# Patient Record
Sex: Female | Born: 1995 | Race: Black or African American | Hispanic: No | Marital: Single | Smoking: Never smoker
Health system: Southern US, Community
[De-identification: ages and names within clinical notes are randomized; demographics above are authoritative.]

## PROBLEM LIST (undated history)

## (undated) DIAGNOSIS — E119 Type 2 diabetes mellitus without complications: Secondary | ICD-10-CM

## (undated) HISTORY — DX: Type 2 diabetes mellitus without complications: E11.9

## (undated) HISTORY — PX: NO PAST SURGERIES: SHX2092

---
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
## 2013-08-20 ENCOUNTER — Emergency Department: Payer: Self-pay | Admitting: Internal Medicine

## 2017-07-07 ENCOUNTER — Emergency Department
Admission: EM | Admit: 2017-07-07 | Discharge: 2017-07-07 | Disposition: A | Discharge status: Home / Self Care | Payer: BLUE CROSS/BLUE SHIELD | Attending: Emergency Medicine | Admitting: Emergency Medicine

## 2017-07-07 ENCOUNTER — Emergency Department: Payer: BLUE CROSS/BLUE SHIELD

## 2017-07-07 ENCOUNTER — Encounter: Payer: Self-pay | Admitting: Emergency Medicine

## 2017-07-07 DIAGNOSIS — R42 Dizziness and giddiness: Secondary | ICD-10-CM | POA: Insufficient documentation

## 2017-07-07 DIAGNOSIS — R51 Headache: Secondary | ICD-10-CM | POA: Insufficient documentation

## 2017-07-07 DIAGNOSIS — G44209 Tension-type headache, unspecified, not intractable: Secondary | ICD-10-CM

## 2017-07-07 MED ORDER — BUTALBITAL-APAP-CAFFEINE 50-325-40 MG PO TABS: 1.0000 | ORAL_TABLET | Freq: Four times a day (QID) | ORAL | 0 refills | Status: AC | PRN

## 2017-07-07 MED ORDER — BUTALBITAL-APAP-CAFFEINE 50-325-40 MG PO TABS
2.0000 | ORAL_TABLET | Freq: Once | ORAL | Status: AC
  Administered 2017-07-07: 2 via ORAL
  Filled 2017-07-07: qty 2

## 2017-07-07 NOTE — ED Triage Notes (Signed)
Patient ambulatory to triage with steady gait, without difficulty or distress noted; pt reports "tightness" to top and sides of head x week

## 2017-07-07 NOTE — ED Provider Notes (Signed)
[facility] Emergency Department Provider Note   ____________________________________________   First MD Initiated Contact with Patient 07/07/17 [phone]     (approximate)  I have reviewed the triage vital signs and the nursing notes.   HISTORY  Chief Complaint Headache    HPI Monica Reynolds is a 21 y.o. female who comes into the hospital today with a tightness on the top of her head and on the sides. The patient reports that she's been having it on and off for about a week. She states that tonight it was very bad so she decided to come in for evaluation. The patient has been taking Tylenol with no relief of her symptoms. She denies any nausea or vomiting but has had some dizziness. The patient reports that she is not having any dizziness right now. She's never had headaches like this before but reports that she's had some increase in her stress. She's been out of work for the past 4 months and cannot find a job. The patient denies any blurred vision numbness or tingling. Her pain is currently a 6 out of 10 in intensity. Again she is here for evaluation.   History reviewed. No pertinent past medical history.  There are no active problems to display for this patient.   History reviewed. No pertinent surgical history.  Prior to Admission medications   Medication Sig Start Date End Date Taking? Authorizing Provider  butalbital-acetaminophen-caffeine (FIORICET, ESGIC) 804165291650-325-40 MG tablet Take 1-2 tablets by mouth every 6 (six) hours as needed for headache. 07/07/17 07/07/18  Rebecka ApleyWebster, Alleta Avery P, MD    Allergies Patient has no known allergies.  No family history on file.  Social History Social History  Substance Use Topics  . Smoking status: Never Smoker  . Smokeless tobacco: Not on file  . Alcohol use No    Review of Systems  Constitutional: No fever/chills Eyes: No visual changes. ENT: No sore throat. Cardiovascular: Denies chest pain. Respiratory:  Denies shortness of breath. Gastrointestinal: No abdominal pain.  No nausea, no vomiting.  No diarrhea.  No constipation. Genitourinary: Negative for dysuria. Musculoskeletal: Negative for back pain. Skin: Negative for rash. Neurological: Headache and dizziness   ____________________________________________   PHYSICAL EXAM:  VITAL SIGNS: ED Triage Vitals  Enc Vitals Group     BP 07/07/17 0233 128/68     Pulse Rate 07/07/17 0233 74     Resp 07/07/17 0233 18     Temp 07/07/17 0233 99 F (37.2 C)     Temp Source 07/07/17 0233 Oral     SpO2 07/07/17 0233 100 %     Weight 07/07/17 0232 200 lb (90.7 kg)     Height 07/07/17 0232 5\' 1"  (1.549 m)     Head Circumference --      Peak Flow --      Pain Score 07/07/17 0232 7     Pain Loc --      Pain Edu? --      Excl. in GC? --     Constitutional: Alert and oriented. Well appearing and in Mild distress. Eyes: Conjunctivae are normal. PERRL. EOMI. Head: Atraumatic. Nose: No congestion/rhinnorhea. Mouth/Throat: Mucous membranes are moist.  Oropharynx non-erythematous. Cardiovascular: Normal rate, regular rhythm. Grossly normal heart sounds.  Good peripheral circulation. Respiratory: Normal respiratory effort.  No retractions. Lungs CTAB. Gastrointestinal: Soft and nontender. No distention. Positive bowel sounds Musculoskeletal: No lower extremity tenderness nor edema.   Neurologic:  Normal speech and language. Cranial nerves II through XII  are grossly intact with no focal motor or neuro deficits Skin:  Skin is warm, dry and intact.  Psychiatric: Mood and affect are normal.   ____________________________________________   LABS (all labs ordered are listed, but only abnormal results are displayed)  Labs Reviewed - No data to display ____________________________________________  EKG  none ____________________________________________  RADIOLOGY  Ct Head Wo Contrast  Result Date: 07/07/2017 CLINICAL DATA:  Headache for 1  week. EXAM: CT HEAD WITHOUT CONTRAST TECHNIQUE: Contiguous axial images were obtained from the base of the skull through the vertex without intravenous contrast. COMPARISON:  None. FINDINGS: Brain: No intracranial hemorrhage, mass effect, or midline shift. No hydrocephalus. The basilar cisterns are patent. No evidence of territorial infarct. No extra-axial or intracranial fluid collection. Vascular: No hyperdense vessel or unexpected calcification. Skull: Normal. Negative for fracture or focal lesion. Sinuses/Orbits: Paranasal sinuses and mastoid air cells are clear. The visualized orbits are unremarkable. Other: None. IMPRESSION: Unremarkable noncontrast head CT. Electronically Signed   By: Rubye OaksMelanie  Ehinger M.D.   On: 07/07/2017 03:57    ____________________________________________   PROCEDURES  Procedure(s) performed: None  Procedures  Critical Care performed: No  ____________________________________________   INITIAL IMPRESSION / ASSESSMENT AND PLAN / ED COURSE  Pertinent labs & imaging results that were available during my care of the patient were reviewed by me and considered in my medical decision making (see chart for details).  This is a 21 year old female who comes into the hospital today with some headache. She reports that this tightness around her head and she has been having some increased stress. I gave the patient 2 Fioricet and I checked a CT scan. The patient's CT scan is unremarkable and after the Fioricet the patient's headache is improved. The patient also reports that she has not been drinking much water. I discussed with the patient that dehydration can lead to headaches as can increase stress. As the patient is feeling improved I will discharge her to home to have her follow-up with her primary care physician or the acute care clinic.      ____________________________________________   FINAL CLINICAL IMPRESSION(S) / ED DIAGNOSES  Final diagnoses:  Acute non  intractable tension-type headache      NEW MEDICATIONS STARTED DURING THIS VISIT:  Discharge Medication List as of 07/07/2017  4:33 AM    START taking these medications   Details  butalbital-acetaminophen-caffeine (FIORICET, ESGIC) 50-325-40 MG tablet Take 1-2 tablets by mouth every 6 (six) hours as needed for headache., Starting Wed 07/07/2017, Until Thu 07/07/2018, Print         Note:  This document was prepared using Dragon voice recognition software and may include unintentional dictation errors.    Rebecka ApleyWebster, Altamese Deguire P, MD 07/07/17 [phone]

## 2017-07-07 NOTE — ED Notes (Signed)
Patient transported to CT 

## 2017-08-07 ENCOUNTER — Emergency Department
Admission: EM | Admit: 2017-08-07 | Discharge: 2017-08-07 | Disposition: A | Discharge status: Home / Self Care | Payer: BLUE CROSS/BLUE SHIELD | Attending: Emergency Medicine | Admitting: Emergency Medicine

## 2017-08-07 ENCOUNTER — Encounter: Payer: Self-pay | Admitting: Emergency Medicine

## 2017-08-07 DIAGNOSIS — G4733 Obstructive sleep apnea (adult) (pediatric): Secondary | ICD-10-CM | POA: Diagnosis not present

## 2017-08-07 DIAGNOSIS — G4730 Sleep apnea, unspecified: Secondary | ICD-10-CM | POA: Diagnosis present

## 2017-08-07 DIAGNOSIS — R0600 Dyspnea, unspecified: Secondary | ICD-10-CM | POA: Diagnosis not present

## 2017-08-07 NOTE — ED Notes (Signed)
Pt. Going home by self. 

## 2017-08-07 NOTE — ED Provider Notes (Signed)
[facility] Emergency Department Provider Note   ____________________________________________   First MD Initiated Contact with Patient 08/07/17 [phone]     (approximate)  I have reviewed the triage vital signs and the nursing notes.   HISTORY  Chief Complaint Sleep Apnea    HPI Monica Reynolds is a 21 y.o. female Who comes into the hospital today with a concern that she has sleep apnea. The patient states that she goes to sleep and then wakes up gasping and sometimes can't catch her breath. She reports that sometimes it'll take a while and she feels that her brain may not be getting enough oxygen. Earlier tonight she had such an episode and then she felt dizzy and had a headache. She has had these symptoms since January. She has not yet seen her primary care physician for this but states that she has an appointment with Dr. Thedore Mins in October. The patient states that she didn't think she could wait until October to be seen so she decided to come into the hospital today to be evaluated for sleep apnea. The patient does not have any chest pain and her symptoms are all resolved. She is here today for evaluation.   History reviewed. No pertinent past medical history.  There are no active problems to display for this patient.   History reviewed. No pertinent surgical history.  Prior to Admission medications   Medication Sig Start Date End Date Taking? Authorizing Provider  butalbital-acetaminophen-caffeine (FIORICET, ESGIC) [id number] MG tablet Take 1-2 tablets by mouth every 6 (six) hours as needed for headache. 07/07/17 07/07/18  Rebecka Apley, MD    Allergies Patient has no known allergies.  No family history on file.  Social History Social History  Substance Use Topics  . Smoking status: Never Smoker  . Smokeless tobacco: Never Used  . Alcohol use No    Review of Systems  Constitutional: No fever/chills Eyes: No visual changes. ENT: No sore  throat. Cardiovascular: Denies chest pain. Respiratory:  shortness of breath. Gastrointestinal: No abdominal pain.  No nausea, no vomiting.  No diarrhea.  No constipation. Genitourinary: Negative for dysuria. Musculoskeletal: Negative for back pain. Skin: Negative for rash. Neurological: Negative for headaches, focal weakness or numbness.   ____________________________________________   PHYSICAL EXAM:  VITAL SIGNS: ED Triage Vitals  Enc Vitals Group     BP 08/07/17 0308 123/62     Pulse Rate 08/07/17 0308 67     Resp 08/07/17 0308 18     Temp 08/07/17 0308 98.4 F (36.9 C)     Temp Source 08/07/17 0308 Oral     SpO2 08/07/17 0308 100 %     Weight 08/07/17 0305 200 lb (90.7 kg)     Height 08/07/17 0305 5\' 1"  (1.549 m)     Head Circumference --      Peak Flow --      Pain Score 08/07/17 0305 0     Pain Loc --      Pain Edu? --      Excl. in GC? --     Constitutional: Alert and oriented. Well appearing and in no acute distress. Eyes: Conjunctivae are normal. PERRL. EOMI. Head: Atraumatic. Nose: No congestion/rhinnorhea. Mouth/Throat: Mucous membranes are moist.  Oropharynx non-erythematous. Cardiovascular: Normal rate, regular rhythm. Grossly normal heart sounds.  Good peripheral circulation. Respiratory: Normal respiratory effort.  No retractions. Lungs CTAB. Gastrointestinal: Soft and nontender. No distention. Positive bowel sounds Musculoskeletal: No lower extremity tenderness nor edema.  Neurologic:  Normal speech and language.  Skin:  Skin is warm, dry and intact.  Psychiatric: Mood and affect are normal.   ____________________________________________   LABS (all labs ordered are listed, but only abnormal results are displayed)  Labs Reviewed - No data to display ____________________________________________  EKG  none ____________________________________________  RADIOLOGY  No results  found.  ____________________________________________   PROCEDURES  Procedure(s) performed: None  Procedures  Critical Care performed: No  ____________________________________________   INITIAL IMPRESSION / ASSESSMENT AND PLAN / ED COURSE  Pertinent labs & imaging results that were available during my care of the patient were reviewed by me and considered in my medical decision making (see chart for details).  This is a 21 year old female who comes into the hospital today with some shortness of breath that wakes her up from sleep. The patient feels that she may have some sleep apnea which is causing the symptoms. The patient's lung sounds are clear and she is in no distress at this time. I feel that the patient may be having some of the symptoms. I informed her though that we are unable to perform a sleep study in the emergency department and she needs to follow up with her primary care physician. I did recommend sleeping on some pillows to elevate her head or sleeping on her side. Otherwise the patient should follow-up. I did offer the patient a chest x-ray and she declined. She'll be discharged home.      ____________________________________________   FINAL CLINICAL IMPRESSION(S) / ED DIAGNOSES  Final diagnoses:  Obstructive sleep apnea syndrome  Dyspnea, unspecified type      NEW MEDICATIONS STARTED DURING THIS VISIT:  New Prescriptions   No medications on file     Note:  This document was prepared using Dragon voice recognition software and may include unintentional dictation errors.    Rebecka Apley, MD 08/07/17 [phone]

## 2017-08-07 NOTE — ED Triage Notes (Signed)
Pt states that when she goes to sleep, she wakes up gasping for air. Pt denies SHOB, CP or any pain. Pt states that other than not being able to sleep, she has no medical conditions. Pt is ambulatory to triage with NAD.

## 2017-08-07 NOTE — ED Notes (Signed)
Pt. States about every other night for the past nine months, she wakes during the night gasping for air.  Pt. Denies having sleep study performed.  Pt. States she does not have a PCP.  Pt. Denies any chest pain or SOB.

## 2018-12-09 IMAGING — CT CT HEAD W/O CM
3 series · 15 of 46 positions shown, 18 images · non-contrast
Comparison: None.

CLINICAL DATA: Headache for 1 week.

EXAM:
CT HEAD WITHOUT CONTRAST
TECHNIQUE: Contiguous axial images were obtained from the base of the skull
through the vertex without intravenous contrast.

[Series 2: head wo · axial · 0.41mm/px · z∈[-81,+39]mm · 9 of 29 slices shown, 12 images]
[im 3/29  brain]
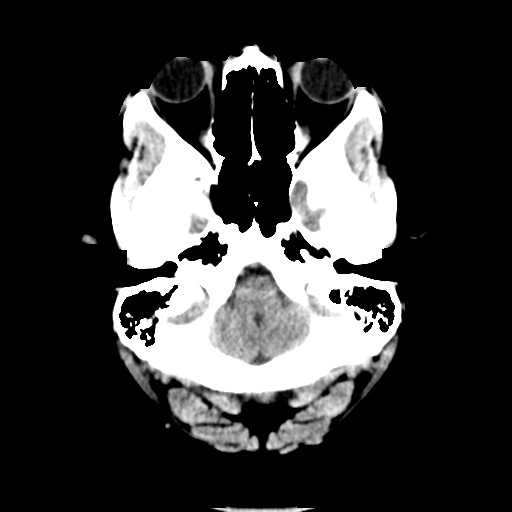
[im 3/29  bone]
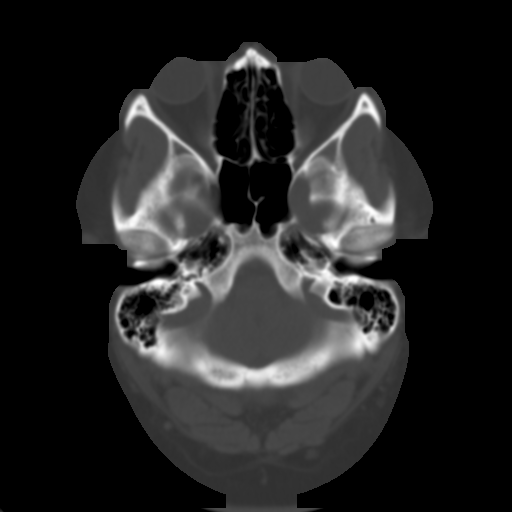
[im 6/29  brain]
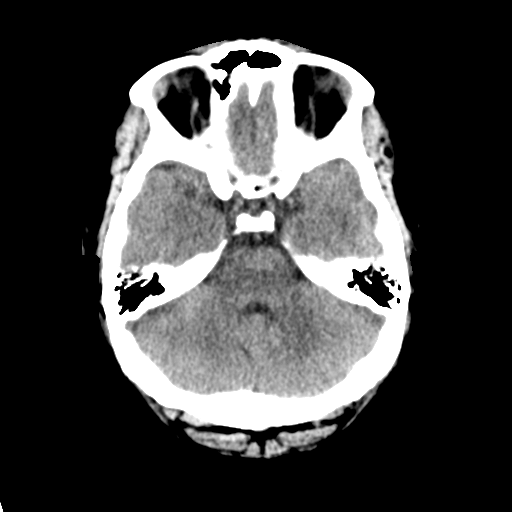
[im 9/29  brain]
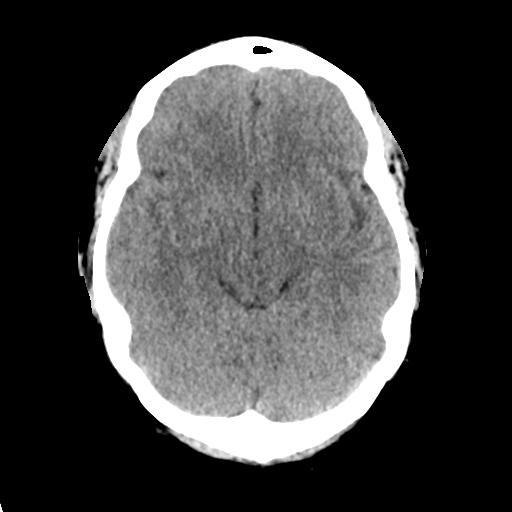
[im 12/29  brain]
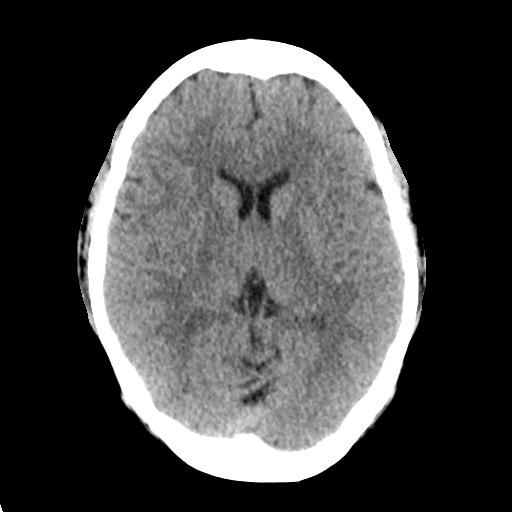
[im 15/29  brain]
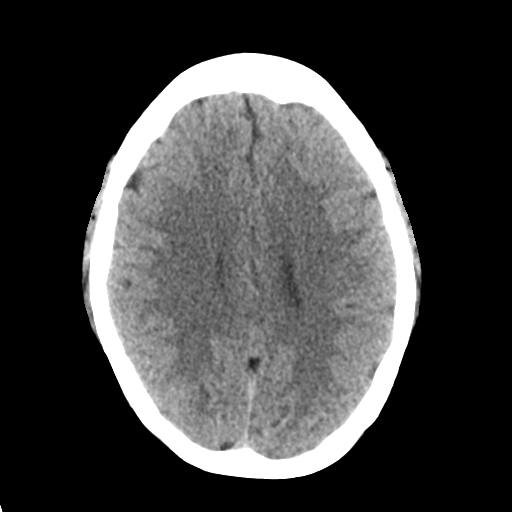
[im 15/29  bone]
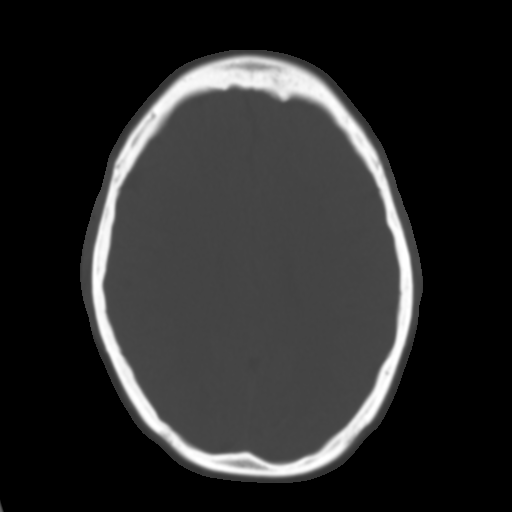
[im 18/29  brain]
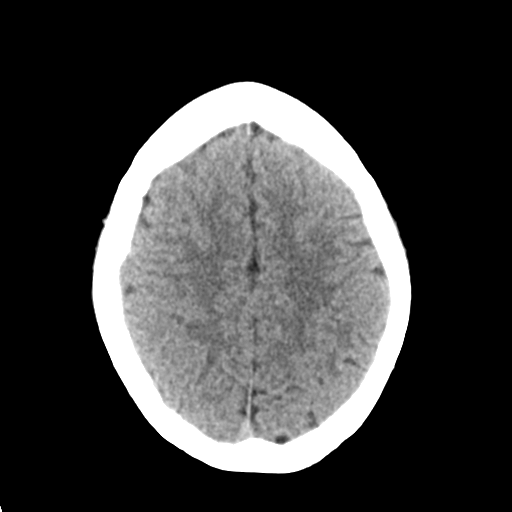
[im 21/29  brain]
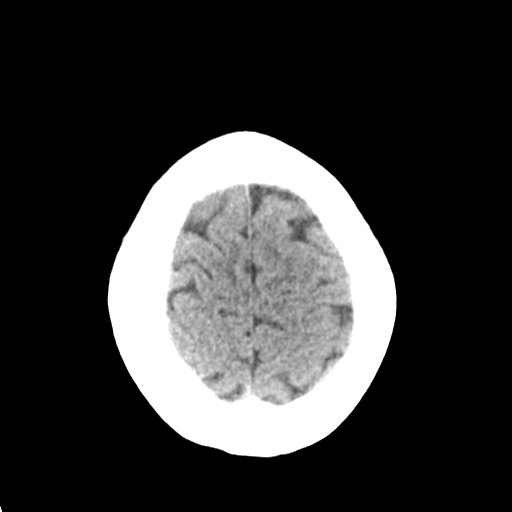
[im 24/29  brain]
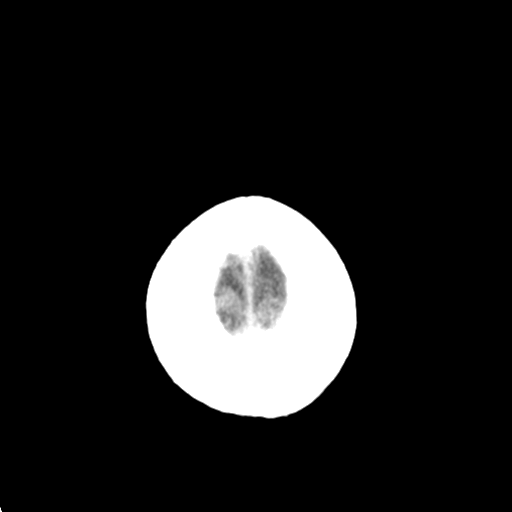
[im 27/29  brain]
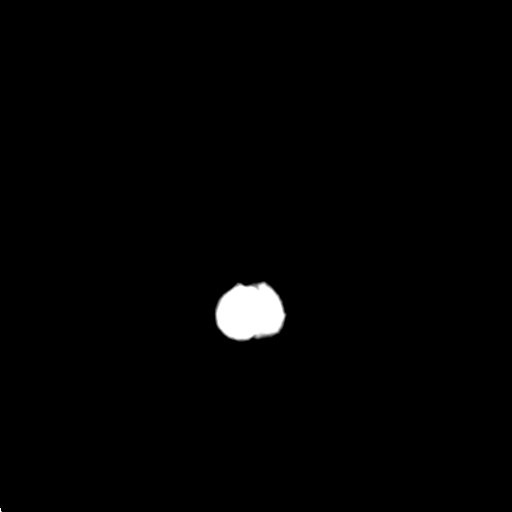
[im 27/29  bone]
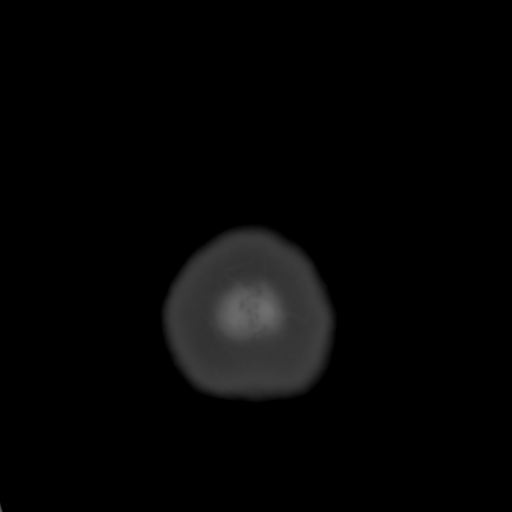

[Series 4: coronal soft tissue · coronal · 0.30mm/px · 3 of 62 slices shown]
[im 21/62  brain]
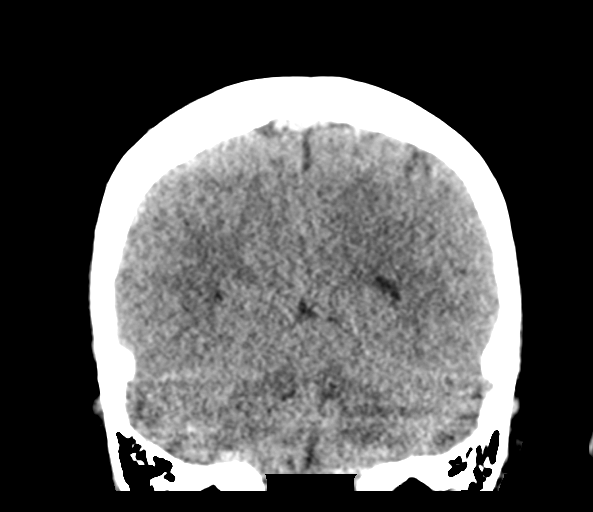
[im 28/62  brain]
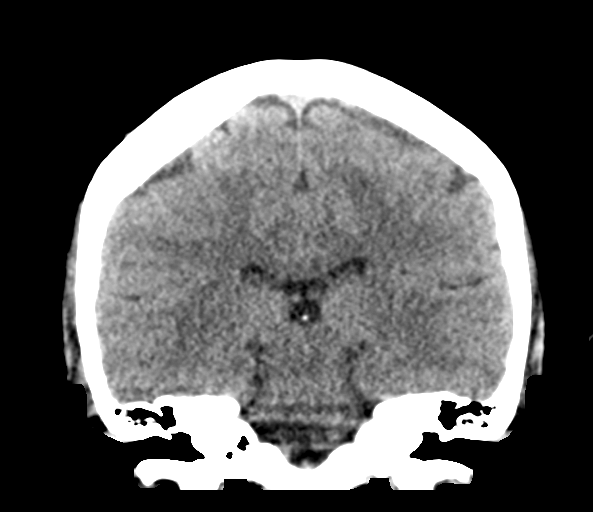
[im 34/62  brain]
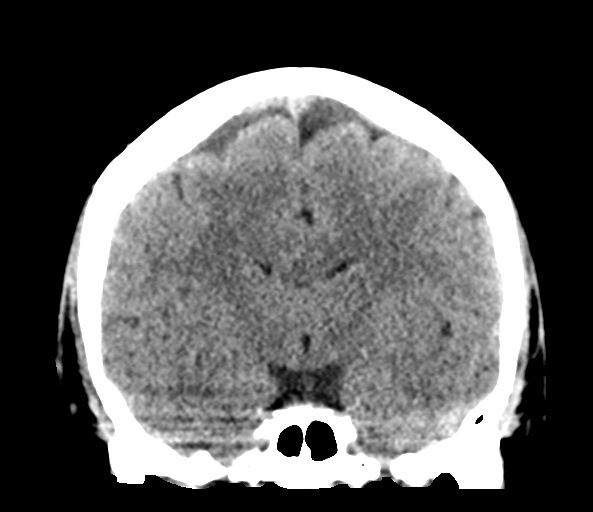

[Series 5: sagittal soft tissue · sagittal · 0.29mm/px · 3 of 52 slices shown]
[im 18/52  brain]
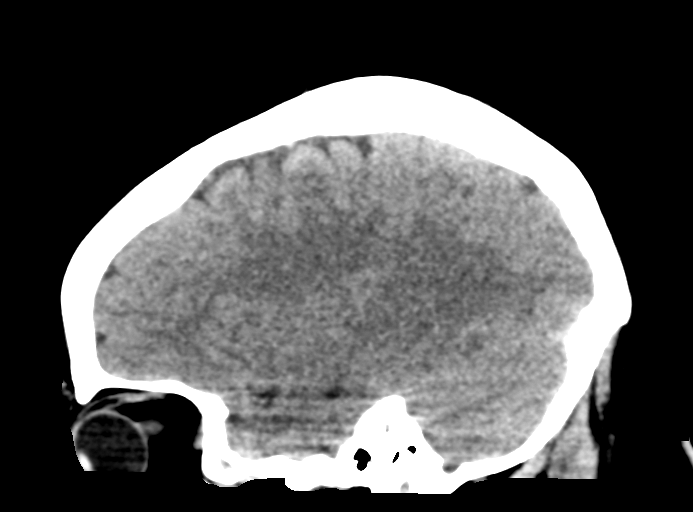
[im 26/52  brain]
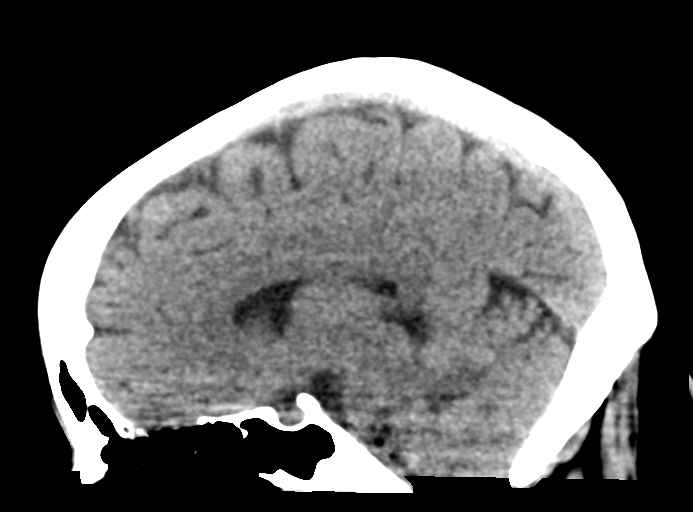
[im 35/52  brain]
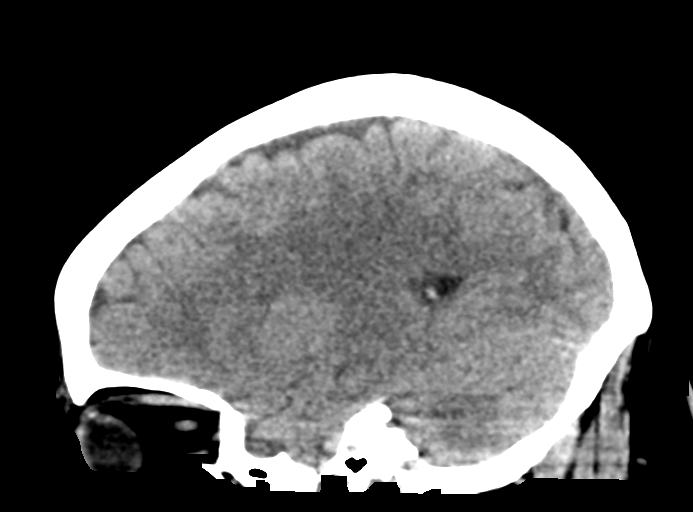

[15 of 46 positions shown; findings below may reference images not displayed]

FINDINGS: Brain: No intracranial hemorrhage, mass effect, or midline shift. No
hydrocephalus. The basilar cisterns are patent. No evidence of
territorial infarct. No extra-axial or intracranial fluid
collection.

Vascular: No hyperdense vessel or unexpected calcification.

Skull: Normal. Negative for fracture or focal lesion.

Sinuses/Orbits: Paranasal sinuses and mastoid air cells are clear.
The visualized orbits are unremarkable.

Other: None.
IMPRESSION: Unremarkable noncontrast head CT.

## 2021-01-05 ENCOUNTER — Emergency Department: Payer: 59

## 2021-01-05 ENCOUNTER — Emergency Department
Admission: EM | Admit: 2021-01-05 | Discharge: 2021-01-05 | Disposition: A | Discharge status: Home / Self Care | Payer: 59 | Attending: Emergency Medicine | Admitting: Emergency Medicine

## 2021-01-05 ENCOUNTER — Other Ambulatory Visit: Payer: Self-pay

## 2021-01-05 DIAGNOSIS — R519 Headache, unspecified: Secondary | ICD-10-CM | POA: Diagnosis present

## 2021-01-05 DIAGNOSIS — G43909 Migraine, unspecified, not intractable, without status migrainosus: Secondary | ICD-10-CM | POA: Insufficient documentation

## 2021-01-05 LAB — COMPREHENSIVE METABOLIC PANEL
ALT: 35 U/L (ref 0–44)
AST: 25 U/L (ref 15–41)
Albumin: 4.1 g/dL (ref 3.5–5.0)
Alkaline Phosphatase: 129 U/L — ABNORMAL HIGH (ref 38–126)
Anion gap: 10 (ref 5–15)
BUN: 9 mg/dL (ref 6–20)
CO2: 25 mmol/L (ref 22–32)
Calcium: 9.3 mg/dL (ref 8.9–10.3)
Chloride: 105 mmol/L (ref 98–111)
Creatinine, Ser: 0.85 mg/dL (ref 0.44–1.00)
GFR, Estimated: >60 mL/min (ref >60)
Glucose, Bld: 101 mg/dL — ABNORMAL HIGH (ref 70–99)
Potassium: 3.8 mmol/L (ref 3.5–5.1)
Sodium: 140 mmol/L (ref 135–145)
Total Bilirubin: 0.7 mg/dL (ref 0.3–1.2)
Total Protein: 7.7 g/dL (ref 6.5–8.1)

## 2021-01-05 LAB — POC URINE PREG, ED: Preg Test, Ur: NEGATIVE

## 2021-01-05 LAB — DIFFERENTIAL
Abs Immature Granulocytes: 0.04 10*3/uL (ref 0.00–0.07)
Basophils Absolute: 0.1 10*3/uL (ref 0.0–0.1)
Basophils Relative: 1 %
Eosinophils Absolute: 0.1 10*3/uL (ref 0.0–0.5)
Eosinophils Relative: 1 %
Immature Granulocytes: 0 %
Lymphocytes Relative: 40 %
Lymphs Abs: 3.6 10*3/uL (ref 0.7–4.0)
Monocytes Absolute: 0.7 10*3/uL (ref 0.1–1.0)
Monocytes Relative: 8 %
Neutro Abs: 4.5 10*3/uL (ref 1.7–7.7)
Neutrophils Relative %: 50 %

## 2021-01-05 LAB — CBC
HCT: 36 % (ref 36.0–46.0)
Hemoglobin: 11.1 g/dL — ABNORMAL LOW (ref 12.0–15.0)
MCH: 25.5 pg — ABNORMAL LOW (ref 26.0–34.0)
MCHC: 30.8 g/dL (ref 30.0–36.0)
MCV: 82.6 fL (ref 80.0–100.0)
Platelets: 550 10*3/uL — ABNORMAL HIGH (ref 150–400)
RBC: 4.36 MIL/uL (ref 3.87–5.11)
RDW: 15.1 % (ref 11.5–15.5)
WBC: 9.1 10*3/uL (ref 4.0–10.5)
nRBC: 0 % (ref 0.0–0.2)

## 2021-01-05 LAB — PROTIME-INR
INR: 1 (ref 0.8–1.2)
Prothrombin Time: 12.4 seconds (ref 11.4–15.2)

## 2021-01-05 LAB — CBG MONITORING, ED: Glucose-Capillary: 80 mg/dL (ref 70–99)

## 2021-01-05 LAB — APTT: aPTT: 28 seconds (ref 24–36)

## 2021-01-05 NOTE — ED Provider Notes (Signed)
[facility] Emergency Department Provider Note ____________________________________________   Event Date/Time   First MD Initiated Contact with Patient 01/05/21 2158     (approximate)  I have reviewed the triage vital signs and the nursing notes.   HISTORY  Chief Complaint Numbness    HPI Monica Reynolds is a 25 y.o. female with no active medical problems who presents with a headache and numbness.  The headache started around 3 PM.  It is mainly on the left side, in the back of her head, and throbbing in nature.  It was not associated with photophobia or phonophobia.  She had no nausea or vomiting.  The patient then developed numbness and tingling in the left side of her face which then went to the left arm.  Over the next several hours, the headache has resolved.  The numbness in the arm has resolved as well.  The numbness in the face has improved but not completely resolved.  The patient denies any associated scotomas or vision changes.  She had no difficulty with speech, coordination, or walking.  She denies any prior history of similar symptoms.   History reviewed. No pertinent past medical history.  There are no problems to display for this patient.   History reviewed. No pertinent surgical history.  Prior to Admission medications   Not on File    Allergies Patient has no known allergies.  History reviewed. No pertinent family history.  Social History Social History   Tobacco Use  . Smoking status: Never Smoker  . Smokeless tobacco: Never Used  Substance Use Topics  . Alcohol use: No  . Drug use: No    Review of Systems  Constitutional: No fever/chills. Eyes: No visual changes. ENT: No sore throat. Cardiovascular: Denies chest pain. Respiratory: Denies shortness of breath. Gastrointestinal: No vomiting. Genitourinary: Negative for dysuria.  Musculoskeletal: Negative for back pain. Skin: Negative for rash. Neurological: Positive  for resolved headache.  Positive for left facial numbness.   ____________________________________________   PHYSICAL EXAM:  VITAL SIGNS: ED Triage Vitals  Enc Vitals Group     BP 01/05/21 1613 109/62     Pulse Rate 01/05/21 1613 64     Resp 01/05/21 1613 18     Temp 01/05/21 1616 98.2 F (36.8 C)     Temp Source 01/05/21 1616 Oral     SpO2 01/05/21 1613 100 %     Weight 01/05/21 1613 250 lb (113.4 kg)     Height 01/05/21 1613 5\' 1"  (1.549 m)     Head Circumference --      Peak Flow --      Pain Score 01/05/21 1613 0     Pain Loc --      Pain Edu? --      Excl. in GC? --     Constitutional: Alert and oriented. Well appearing and in no acute distress. Eyes: Conjunctivae are normal.  EOMI.  PERRLA. Head: Atraumatic. Nose: No congestion/rhinnorhea. Mouth/Throat: Mucous membranes are moist.   Neck: Normal range of motion.  Cardiovascular: Normal rate, regular rhythm. Good peripheral circulation. Respiratory: Normal respiratory effort.  No retractions.  Gastrointestinal: No distention.  Musculoskeletal: Extremities warm and well perfused.  Neurologic:  Normal speech and language.  No facial droop.  Mild subjective numbness to the left face, V2 and V3 distribution.  5/5 motor strength and intact sensation to bilateral upper and lower extremities.  Normal coordination with no ataxia on finger-to-nose.  No pronator drift.  Normal gait.  Skin:  Skin is warm and dry. No rash noted. Psychiatric: Mood and affect are normal. Speech and behavior are normal.  ____________________________________________   LABS (all labs ordered are listed, but only abnormal results are displayed)  Labs Reviewed  CBC - Abnormal; Notable for the following components:      Result Value   Hemoglobin 11.1 (*)    MCH 25.5 (*)    Platelets 550 (*)    All other components within normal limits  COMPREHENSIVE METABOLIC PANEL - Abnormal; Notable for the following components:   Glucose, Bld 101 (*)     Alkaline Phosphatase 129 (*)    All other components within normal limits  POC URINE PREG, ED - Normal  PROTIME-INR  APTT  DIFFERENTIAL  CBG MONITORING, ED  I-STAT CREATININE, ED  CBG MONITORING, ED   ____________________________________________  EKG  ED ECG REPORT I, Dionne Bucy, the attending physician, personally viewed and interpreted this ECG.  Date: 01/05/2021 EKG Time: 1559 Rate: 63 Rhythm: normal sinus rhythm QRS Axis: normal Intervals: normal ST/T Wave abnormalities: normal Narrative Interpretation: no evidence of acute ischemia  ____________________________________________  RADIOLOGY  CT head: No ICH, acute stroke, or other acute abnormality  ____________________________________________   PROCEDURES  Procedure(s) performed: No  Procedures  Critical Care performed: No ____________________________________________   INITIAL IMPRESSION / ASSESSMENT AND PLAN / ED COURSE  Pertinent labs & imaging results that were available during my care of the patient were reviewed by me and considered in my medical decision making (see chart for details).  25 year old female with no significant past medical history presents with left-sided headache this afternoon associated with left-sided facial and arm numbness.  The headache and arm numbness have completely resolved and the facial numbness has mostly resolved.  On exam, the patient is well-appearing.  Her vital signs are normal.  The physical exam is unremarkable except for mild decrease sensation to the lower part of the left face.  The triage RN had apparently noted some mild facial droop, however there is no facial droop during my exam.  Thorough neurologic exam is otherwise completely normal.  CT head and lab work-up were obtained from triage and are all within normal limits.  The patient's symptoms have been steadily resolving over the last several hours while she has been waiting.  Overall presentation is  consistent with complex migraine.  I do not suspect Bell's palsy given the predominance of sensory symptoms and the involvement of the arm.  Given that this is a young and otherwise healthy patient, the risk of stroke is extremely low and the presence of headache and overall progression of her symptoms are not consistent with acute stroke.  I will discussed the case with neurology to determine if she needs any further work-up.  ----------------------------------------- 10:24 PM on 01/05/2021 -----------------------------------------  I consulted Dr. Thomasena Edis from neurology.  He advises that the presentation is consistent with complex migraine.  Based on the patient's age, lack of vascular risk factors, and the nature and progression of the symptoms (descending onset, acsending resolution), he does not recommend any further work-up in the ED.  The patient herself also feels well and would like to go home.  At this time, the patient is stable for discharge.  I counseled her on the results of the work-up.  I have given her neurology referral in case she has any recurrent migraine symptoms that would require outpatient follow-up.  I gave her very thorough return precautions and she expressed understanding.  ____________________________________________  FINAL CLINICAL IMPRESSION(S) / ED DIAGNOSES  Final diagnoses:  Migraine without status migrainosus, not intractable, unspecified migraine type      NEW MEDICATIONS STARTED DURING THIS VISIT:  New Prescriptions   No medications on file     Note:  This document was prepared using Dragon voice recognition software and may include unintentional dictation errors.   Dionne Bucy, MD 01/05/21 2226

## 2021-01-05 NOTE — ED Notes (Signed)
Dr. Marisa Severin with patient.

## 2021-01-05 NOTE — ED Triage Notes (Addendum)
Pt comes pov with left sided facial numbness starting today at 230pm. Pt endorses some eye twitching, dryness, and some minor facial swelling. Minor droop noted with smile. Per Dr Scotty Court, no CODE stroke at this time. Pt also states headache prior to symptoms starting but has since resided.

## 2021-01-05 NOTE — ED Notes (Signed)
Pt to ED c/o left sided numbness. Pt states that her symptoms started around 1530, LKW 1430. Pt pulled to triage 1. Pt does not have facial droop. Pt states that her speech is normal, pt denies HA or dizziness. Pt CBG check and EKG obtained by ED tech. This RN spoke with Dr. Scotty Court who advised to do stroke protocol on pt but do not call code stroke at this time.

## 2021-01-05 NOTE — Discharge Instructions (Addendum)
You likely had what is called a complex migraine, which is a type of migraine headache that involves neurologic symptoms (numbness or weakness).  If these episodes continue you can follow-up with a neurologist.  A referral has been provided.  Return to the ER immediately for new, worsening, or persistent severe headache, recurrent or worsening numbness, weakness in the arm or leg, facial droop, difficulty walking, problems with coordination, speech, changes in your vision, or any other new or worsening symptoms that concern you.

## 2021-04-09 ENCOUNTER — Ambulatory Visit: Payer: Self-pay

## 2021-04-22 ENCOUNTER — Encounter: Payer: Self-pay | Admitting: Obstetrics and Gynecology

## 2021-04-22 ENCOUNTER — Other Ambulatory Visit: Payer: Self-pay

## 2021-04-22 ENCOUNTER — Ambulatory Visit (INDEPENDENT_AMBULATORY_CARE_PROVIDER_SITE_OTHER): Payer: 59 | Admitting: Obstetrics and Gynecology

## 2021-04-22 VITALS — BP 102/70 | Ht 61.0 in | Wt 233.0 lb

## 2021-04-22 DIAGNOSIS — N921 Excessive and frequent menstruation with irregular cycle: Secondary | ICD-10-CM

## 2021-04-22 NOTE — Progress Notes (Signed)
Patient, No Pcp Per (Inactive)   Chief Complaint  Patient presents with  . Menorrhagia    A lot of clots, severe cramping at times x few years    HPI:      Ms. Monica Reynolds is a 25 y.o. No obstetric history on file. whose LMP was Patient's last menstrual period was 04/16/2021 (approximate)., presents today for NP eval of menometrorrhagia, referred by PCP. Menarche age 45, always heavy. Menses are Q1-2 months, lasting 7 days with heavy flow, having to wear 3 pads at a time and changing every few hrs. Has large clots, no BTB. Had dysmen, improved with NSAIDs but sometimes has to miss work/activities due to pain. Did OCPs in 2018 for 3-4 months but stopped due to BTB. Menses were monthly before OCPs but have been Q1-2 months sx. Mild IDA on 1/22 labs, taking Fe supp. No hx of HTN, DVTs, migraines with aura. She has been sex active in past, not currently. Last pap last yr. No hx of abn paps.  Hx of DM  Past Medical History:  Diagnosis Date  . Type 2 diabetes mellitus (HCC)     Past Surgical History:  Procedure Laterality Date  . NO PAST SURGERIES      Family History  Problem Relation Age of Onset  . Lung cancer Maternal Grandmother     Social History   Socioeconomic History  . Marital status: Single    Spouse name: Not on file  . Number of children: Not on file  . Years of education: Not on file  . Highest education level: Not on file  Occupational History  . Not on file  Tobacco Use  . Smoking status: Never Smoker  . Smokeless tobacco: Never Used  Vaping Use  . Vaping Use: Never used  Substance and Sexual Activity  . Alcohol use: No  . Drug use: No  . Sexual activity: Not Currently    Birth control/protection: None  Other Topics Concern  . Not on file  Social History Narrative  . Not on file   Social Determinants of Health   Financial Resource Strain: Not on file  Food Insecurity: Not on file  Transportation Needs: Not on file  Physical Activity: Not on  file  Stress: Not on file  Social Connections: Not on file  Intimate Partner Violence: Not on file    Outpatient Medications Prior to Visit  Medication Sig Dispense Refill  . Continuous Blood Gluc Sensor (FREESTYLE LIBRE 14 DAY SENSOR) MISC See admin instructions.    . D3-50 1.25 MG (50000 UT) capsule Take 50,000 Units by mouth once a week.     No facility-administered medications prior to visit.      ROS:  Review of Systems  Constitutional: Negative for fever.  Gastrointestinal: Negative for blood in stool, constipation, diarrhea, nausea and vomiting.  Genitourinary: Positive for menstrual problem. Negative for dyspareunia, dysuria, flank pain, frequency, hematuria, urgency, vaginal bleeding, vaginal discharge and vaginal pain.  Musculoskeletal: Negative for back pain.  Skin: Negative for rash.    OBJECTIVE:   Vitals:  BP 102/70   Ht 5\' 1"  (1.549 m)   Wt 233 lb (105.7 kg)   LMP 04/16/2021 (Approximate)   BMI 44.02 kg/m   Physical Exam Vitals reviewed.  Constitutional:      Appearance: She is well-developed.  Pulmonary:     Effort: Pulmonary effort is normal.  Abdominal:     Palpations: Abdomen is soft.     Tenderness: There  is no abdominal tenderness. There is no guarding or rebound.  Genitourinary:    General: Normal vulva.     Pubic Area: No rash.      Labia:        Right: No rash, tenderness or lesion.        Left: No rash, tenderness or lesion.      Vagina: Normal. No vaginal discharge, erythema or tenderness.     Cervix: Normal.     Uterus: Normal. Not enlarged and not tender.      Adnexa: Right adnexa normal and left adnexa normal.       Right: No mass or tenderness.         Left: No mass or tenderness.    Musculoskeletal:        General: Normal range of motion.     Cervical back: Normal range of motion.  Skin:    General: Skin is warm and dry.  Neurological:     General: No focal deficit present.     Mental Status: She is alert and oriented to  person, place, and time.  Psychiatric:        Mood and Affect: Mood normal.        Behavior: Behavior normal.        Thought Content: Thought content normal.        Judgment: Judgment normal.     Assessment/Plan: Menometrorrhagia - Plan: US PELVIC COMPLETE WITH TRANSVAGINAL; long hx of sx with IDA, neg exam. Cont Fe supp. Check GYN u/s. Will f/u with results. Discussed BC options, lysteda, IUD, but pt prefers not to do hormones. Wants to think about tx options but not excited about any of them. Will discuss further with u/s results/sooner prn. Questions answered.      Return if symptoms worsen or fail to improve.  Dresden Ament B. Denvil Canning, PA-C 04/22/2021 4:08 PM

## 2021-04-22 NOTE — Patient Instructions (Signed)
I value your feedback and you entrusting us with your care. If you get a [facility] patient survey, I would appreciate you taking the time to let us know about your experience today. Thank you! ? ? ?

## 2021-05-30 ENCOUNTER — Ambulatory Visit: Payer: 59

## 2021-08-08 ENCOUNTER — Ambulatory Visit: Payer: 59 | Attending: Obstetrics and Gynecology

## 2021-08-12 ENCOUNTER — Encounter: Payer: Self-pay | Admitting: Advanced Practice Midwife

## 2021-08-12 ENCOUNTER — Other Ambulatory Visit: Payer: Self-pay

## 2021-08-12 ENCOUNTER — Ambulatory Visit: Payer: Self-pay | Admitting: Advanced Practice Midwife

## 2021-08-12 DIAGNOSIS — Z113 Encounter for screening for infections with a predominantly sexual mode of transmission: Secondary | ICD-10-CM

## 2021-08-12 DIAGNOSIS — B379 Candidiasis, unspecified: Secondary | ICD-10-CM

## 2021-08-12 DIAGNOSIS — E119 Type 2 diabetes mellitus without complications: Secondary | ICD-10-CM

## 2021-08-12 LAB — WET PREP FOR TRICH, YEAST, CLUE: Trichomonas Exam: NEGATIVE

## 2021-08-12 MED ORDER — CLOTRIMAZOLE 1 % VA CREA: 1.0000 | TOPICAL_CREAM | Freq: Every day | VAGINAL | 0 refills | Status: AC

## 2021-08-12 NOTE — Progress Notes (Signed)
[facility] Department STI clinic/screening visit  Subjective:  Monica Reynolds is a 25 y.o. SBF nullip nonsmoker female being seen today for an STI screening visit. The patient reports they do have symptoms.  Patient reports that they do not desire a pregnancy in the next year.   They reported they are not interested in discussing contraception today.  Patient's last menstrual period was 05/28/2021 (approximate).   Patient has the following medical conditions:   Patient Active Problem List   Diagnosis Date Noted   Morbid obesity (HCC) 233 lbs 08/12/2021   Diabetes mellitus without complication (HCC) dx'd 01/26/2020 08/12/2021    Chief Complaint  Patient presents with   SEXUALLY TRANSMITTED DISEASE    Screening    HPI  Patient reports internal and external burning and irritation intermitently since end of 01/2021. Last sex 01/2021 without condom; 0 sex partners in last 3 mo. LMP 05/28/21. Last ETOH 03/2021 (1 glass wine) "rarely". Denies cigs, vaping, cigars. Using vibrator with KY jelly but states she cleans it after every use. Showers in hot water with vagisil; to use tepid water to bathe and try a different lubricant  Last HIV test per patient/review of record was unsure Patient reports last pap was 2020 neg [facility] in [place]  See flowsheet for further details and programmatic requirements.    The following portions of the patient's history were reviewed and updated as appropriate: allergies, current medications, past medical history, past social history, past surgical history and problem list.  Objective:  There were no vitals filed for this visit.  Physical Exam Vitals and nursing note reviewed.  Constitutional:      Appearance: Normal appearance. She is obese.  HENT:     Head: Normocephalic and atraumatic.     Mouth/Throat:     Mouth: Mucous membranes are moist.     Pharynx: Oropharynx is clear. No pharyngeal swelling, oropharyngeal exudate, posterior  oropharyngeal erythema or uvula swelling.  Eyes:     Conjunctiva/sclera: Conjunctivae normal.  Pulmonary:     Effort: Pulmonary effort is normal.  Abdominal:     Palpations: Abdomen is soft. There is no mass.     Tenderness: There is no abdominal tenderness. There is no rebound.     Comments: Soft without masses or tenderness, poor tone, increased adiopose  Genitourinary:    General: Normal vulva.     Exam position: Lithotomy position.     Pubic Area: No rash or pubic lice.      Labia:        Right: No rash or lesion.        Left: No rash or lesion.      Vagina: Vaginal discharge (white creamy leukorrhea, ph<4,5) present. No erythema, bleeding or lesions.     Cervix: Normal.     Uterus: Normal.      Adnexa: Right adnexa normal and left adnexa normal.     Rectum: Normal.  Lymphadenopathy:     Head:     Right side of head: No preauricular or posterior auricular adenopathy.     Left side of head: No preauricular or posterior auricular adenopathy.     Cervical: No cervical adenopathy.     Right cervical: No superficial, deep or posterior cervical adenopathy.    Left cervical: No superficial, deep or posterior cervical adenopathy.     Upper Body:     Right upper body: No supraclavicular or axillary adenopathy.     Left upper body: No supraclavicular or axillary  adenopathy.     Lower Body: No right inguinal adenopathy. No left inguinal adenopathy.  Skin:    General: Skin is warm and dry.     Findings: No rash.  Neurological:     Mental Status: She is alert and oriented to person, place, and time.     Assessment and Plan:  Monica Reynolds is a 25 y.o. female presenting to the [facility] Department for STI screening  1. Morbid obesity (HCC) 233 lbs   2. Screening examination for venereal disease Treat wet mount per standing orders Immunization nurse consult - WET PREP FOR TRICH, YEAST, CLUE - Chlamydia/Gonorrhea [facility] - Gonococcus culture  3. Diabetes  mellitus without complication (HCC) dx'd 01/26/2020      No follow-ups on file.  No future appointments.  Alberteen Spindle, CNM

## 2021-08-12 NOTE — Addendum Note (Signed)
Addended by: Berdie Ogren on: 08/12/2021 05:14 PM   Modules accepted: Orders

## 2021-08-12 NOTE — Progress Notes (Signed)
Pt here for STD screening.  Wet mount results reviewed and medication dispensed per SO.  Pt declined condoms. Reyanne Hussar M Quatavious Rossa, RN  

## 2021-08-14 ENCOUNTER — Telehealth: Payer: Self-pay | Admitting: Family Medicine

## 2021-08-14 NOTE — Telephone Encounter (Signed)
Patient states that she recently received a cream for yeast infection on her last visit and wanted to know if she can still use it because she started her menstrual cycle this morning.

## 2021-08-16 LAB — GONOCOCCUS CULTURE

## 2021-10-31 ENCOUNTER — Ambulatory Visit: Payer: 59

## 2021-11-11 ENCOUNTER — Ambulatory Visit: Payer: Self-pay | Admitting: Family Medicine

## 2021-11-11 ENCOUNTER — Encounter: Payer: Self-pay | Admitting: Family Medicine

## 2021-11-11 ENCOUNTER — Other Ambulatory Visit: Payer: Self-pay

## 2021-11-11 DIAGNOSIS — Z113 Encounter for screening for infections with a predominantly sexual mode of transmission: Secondary | ICD-10-CM

## 2021-11-11 DIAGNOSIS — Z202 Contact with and (suspected) exposure to infections with a predominantly sexual mode of transmission: Secondary | ICD-10-CM

## 2021-11-11 LAB — WET PREP FOR TRICH, YEAST, CLUE
Trichomonas Exam: NEGATIVE
Yeast Exam: NEGATIVE

## 2021-11-11 MED ORDER — DOXYCYCLINE HYCLATE 100 MG PO TABS: 100.0000 mg | ORAL_TABLET | Freq: Two times a day (BID) | ORAL | 0 refills | Status: AC

## 2021-11-11 NOTE — Progress Notes (Signed)
Pt here for STD screening and treatment for Chlamydia.  Wet mount results reviewed.  Medication dispensed per Provider orders.  Condoms given to pt. Berdie Ogren, RN

## 2021-11-11 NOTE — Progress Notes (Signed)
[facility] Department STI clinic/screening visit  Subjective:  Monica Reynolds is a 25 y.o. female being seen today for an STI screening visit. The patient reports they do have symptoms.  Patient reports that they do not desire a pregnancy in the next year.   They reported they are not interested in discussing contraception today.  Patient's last menstrual period was 09/27/2021 (exact date).   Patient has the following medical conditions:   Patient Active Problem List   Diagnosis Date Noted   Morbid obesity (HCC) 233 lbs 08/12/2021   Diabetes mellitus without complication (HCC) dx'd 01/26/2020 08/12/2021    Chief Complaint  Patient presents with   SEXUALLY TRANSMITTED DISEASE    Screening    HPI  Patient reports here for chlamydia, pt reports exposure to chlamydia   Last HIV test per patient/review of record was a few years ago  Patient reports last pap was 12/15/2019  See flowsheet for further details and programmatic requirements.    The following portions of the patient's history were reviewed and updated as appropriate: allergies, current medications, past medical history, past social history, past surgical history and problem list.  Objective:  There were no vitals filed for this visit.  Physical Exam Vitals and nursing note reviewed.  Constitutional:      Appearance: Normal appearance.  HENT:     Head: Normocephalic and atraumatic.     Mouth/Throat:     Mouth: Mucous membranes are moist.     Pharynx: Oropharynx is clear. No oropharyngeal exudate or posterior oropharyngeal erythema.  Pulmonary:     Effort: Pulmonary effort is normal.  Abdominal:     General: Abdomen is flat.     Palpations: There is no mass.     Tenderness: There is no abdominal tenderness. There is no rebound.  Genitourinary:    General: Normal vulva.     Exam position: Lithotomy position.     Pubic Area: No rash or pubic lice.      Labia:        Right: No rash or lesion.         Left: No rash or lesion.      Vagina: Normal. No vaginal discharge, erythema, bleeding or lesions.     Cervix: No cervical motion tenderness, discharge, friability, lesion or erythema.     Uterus: Normal.      Adnexa: Right adnexa normal and left adnexa normal.     Rectum: Normal.     Comments: External genitalia without, lice, nits, erythema, edema , lesions or inguinal adenopathy. Vagina with normal mucosa and discharge and pH equals 4.  Cervix without visual lesions, uterus firm, mobile, non-tender, no masses, CMT adnexal fullness or tenderness.   Musculoskeletal:     Cervical back: Normal range of motion and neck supple.  Lymphadenopathy:     Head:     Right side of head: No preauricular or posterior auricular adenopathy.     Left side of head: No preauricular or posterior auricular adenopathy.     Cervical: No cervical adenopathy.     Upper Body:     Right upper body: No supraclavicular or axillary adenopathy.     Left upper body: No supraclavicular or axillary adenopathy.     Lower Body: No right inguinal adenopathy. No left inguinal adenopathy.  Skin:    General: Skin is warm and dry.     Findings: No rash.  Neurological:     Mental Status: She is alert and oriented to  person, place, and time.  Psychiatric:        Mood and Affect: Mood normal.        Behavior: Behavior normal.     Assessment and Plan:  Monica Reynolds is a 25 y.o. female presenting to the [facility] Department for STI screening  1. Screening examination for venereal disease Patient accepted all screenings including wet prep, oral, vaginal CT/GC and declines bloodwork for HIV/RPR.  Patient meets criteria for HepB screening? No. Ordered? No - does not meet criteria  Patient meets criteria for HepC screening? No. Ordered? No - does not meet criteria   Wet prep results neg    Treatment needed for chlamydia exposure  Discussed time line for [facility] results and that patient will be called with  positive results and encouraged patient to call if she had not heard in 2 weeks.  Counseled to return or seek care for continued or worsening symptoms Recommended condom use with all sex  Patient is currently using  no BCM   to prevent pregnancy.  - Chlamydia/Gonorrhea [place] [facility] - WET PREP FOR TRICH, YEAST, CLUE  2. Exposure to chlamydia Pt reports fertility issues, will treat with doxycycline 100 mg BID x 7 days.   - doxycycline (VIBRA-TABS) 100 MG tablet; Take 1 tablet (100 mg total) by mouth 2 (two) times daily for 7 days.  Dispense: 14 tablet; Refill: 0     Return for as needed.  No future appointments.  Wendi Snipes, FNP

## 2023-07-23 ENCOUNTER — Other Ambulatory Visit: Payer: Self-pay

## 2023-07-23 DIAGNOSIS — N3000 Acute cystitis without hematuria: Secondary | ICD-10-CM | POA: Insufficient documentation

## 2023-07-23 DIAGNOSIS — R339 Retention of urine, unspecified: Secondary | ICD-10-CM | POA: Diagnosis present

## 2023-07-23 LAB — POC URINE PREG, ED: Preg Test, Ur: NEGATIVE

## 2023-07-23 LAB — CBG MONITORING, ED: Glucose-Capillary: 98 mg/dL (ref 70–99)

## 2023-07-23 NOTE — ED Triage Notes (Signed)
Pt states she hasn't been able to urinate more than a few drops since this morning and c/o pressure on bladder and discomfort. Pt is diabetic.

## 2023-07-23 NOTE — ED Notes (Signed)
Gave pt a urinalysis cup and advised to try to collect urine for [place]. Pt states she is unable to go to the bathroom. Advised her to keep the cup just in case.

## 2023-07-23 NOTE — ED Notes (Signed)
Bladder scan shows 0ml after multiple attempts

## 2023-07-24 ENCOUNTER — Emergency Department
Admission: EM | Admit: 2023-07-24 | Discharge: 2023-07-24 | Disposition: A | Discharge status: Home / Self Care | Payer: BC Managed Care – PPO | Attending: Emergency Medicine | Admitting: Emergency Medicine

## 2023-07-24 DIAGNOSIS — N3000 Acute cystitis without hematuria: Secondary | ICD-10-CM

## 2023-07-24 MED ORDER — CEFADROXIL 500 MG PO CAPS: 500.0000 mg | ORAL_CAPSULE | Freq: Two times a day (BID) | ORAL | 0 refills | Status: AC

## 2023-07-24 MED ORDER — PHENAZOPYRIDINE HCL 95 MG PO TABS: 190.0000 mg | ORAL_TABLET | Freq: Three times a day (TID) | ORAL | 0 refills | Status: AC | PRN

## 2023-07-24 MED ORDER — CEPHALEXIN 500 MG PO CAPS: 500.0000 mg | ORAL_CAPSULE | Freq: Once | ORAL | Status: DC

## 2023-07-24 NOTE — Group Note (Deleted)
Date:  07/24/2023 Time:  2:12 AM  Group Topic/Focus:  Wrap-Up Group:   The focus of this group is to help patients review their daily goal of treatment and discuss progress on daily workbooks.     Participation Level:  {BHH PARTICIPATION ZOXWR:60454}  Participation Quality:  {BHH PARTICIPATION QUALITY:22265}  Affect:  {BHH AFFECT:22266}  Cognitive:  {BHH COGNITIVE:22267}  Insight: {BHH Insight2:20797}  Engagement in Group:  {BHH ENGAGEMENT IN UJWJX:91478}  Modes of Intervention:  {BHH MODES OF INTERVENTION:22269}  Additional Comments:  ***  Maglione,Samya Siciliano E 07/24/2023, 2:12 AM

## 2023-07-24 NOTE — ED Provider Notes (Signed)
[facility] Provider Note    Event Date/Time   First MD Initiated Contact with Patient 07/24/23 0138     (approximate)   History   Urinary Retention   HPI Monica Reynolds is a 27 y.o. female who presents for evaluation of urinary pain, increased frequency, and increased urgency.  She feels like she cannot fully empty her bladder.  The symptoms started when she first got up within the last 24 hours.  She has had only 1 urinary tract infection in the past.  She has no pain in her back or radiating up, the pain seems to be isolated to the sensation of urgency and incomplete urination as well as burning when she pees.  She has had no fever, chills, chest pain, shortness of breath, nausea, vomiting, nor abdominal pain.  No flank pain.  No vaginal complaints or concerns.     Physical Exam   Triage Vital Signs: ED Triage Vitals  Encounter Vitals Group     BP 07/23/23 2137 (!) 137/91     Systolic BP Percentile --      Diastolic BP Percentile --      Pulse Rate 07/23/23 2137 87     Resp 07/23/23 2137 18     Temp 07/23/23 2137 99.4 F (37.4 C)     Temp Source 07/23/23 2137 Oral     SpO2 07/23/23 2137 98 %     Weight --      Height --      Head Circumference --      Peak Flow --      Pain Score 07/23/23 2138 2     Pain Loc --      Pain Education --      Exclude from Growth Chart --     Most recent vital signs: Vitals:   07/24/23 0300 07/24/23 0330  BP: 118/75 (!) 137/96  Pulse: 67 69  Resp:    Temp:    SpO2: 100% 100%    General: Awake, no distress.  CV:  Good peripheral perfusion.  Resp:  Normal effort. Speaking easily and comfortably, no accessory muscle usage nor intercostal retractions.   Abd:  No distention.  Obese.  No tenderness to palpation of the abdomen including the suprapubic region.   ED Results / Procedures / Treatments   Labs (all labs ordered are listed, but only abnormal results are displayed) Labs Reviewed  URINALYSIS,  ROUTINE W REFLEX MICROSCOPIC - Abnormal; Notable for the following components:      Result Value   Color, Urine AMBER (*)    APPearance CLOUDY (*)    Hgb urine dipstick LARGE (*)    Protein, ur >=300 (*)    Leukocytes,Ua MODERATE (*)    All other components within normal limits  URINE CULTURE  POC URINE PREG, ED  CBG MONITORING, ED    PROCEDURES:  Critical Care performed: No  Procedures    IMPRESSION / MDM / ASSESSMENT AND PLAN / ED COURSE  I reviewed the triage vital signs and the nursing notes.                              Differential diagnosis includes, but is not limited to, UTI/cystitis, pyelonephritis, STD, acute urinary retention or obstruction.  Patient's presentation is most consistent with acute presentation with potential threat to life or bodily function.  Labs/studies ordered: Urinalysis, urine culture, pregnancy test  Interventions/Medications given:  Medications  cephALEXin (KEFLEX) capsule 500 mg (500 mg Oral Not Given 07/24/23 0340)    (Note:  hospital course my include additional interventions and/or labs/studies not listed above.)  Vital signs are stable and within normal limits.  Urinalysis consistent with UTI/cystitis.  Urine pregnancy test negative.  No systemic signs to suggest pyelonephritis.  No suggestion of ovarian etiology.  1 dose Keflex in the emergency department with prescriptions as listed below.  I encouraged outpatient follow-up with her primary care doctor.  She understands and agrees with the plan.  No indication of any worse etiology at this time.  Of note, bladder scan was performed when she arrived and felt like she was not able to empty her bladder and her bladder was found to be empty.     FINAL CLINICAL IMPRESSION(S) / ED DIAGNOSES   Final diagnoses:  Acute cystitis without hematuria     Rx / DC Orders   ED Discharge Orders          Ordered    cefadroxil (DURICEF) 500 MG capsule  2 times daily        07/24/23 0335     phenazopyridine (PYRIDIUM) 95 MG tablet  3 times daily with meals PRN        07/24/23 0527             Note:  This document was prepared using Dragon voice recognition software and may include unintentional dictation errors.   Loleta Rose, MD 07/24/23 [phone]

## 2023-07-24 NOTE — Discharge Instructions (Signed)
You have been seen in the Emergency Department (ED) today for pain when urinating, as well as increased urinary frequency and inability to fully empty your bladder.  Your workup today suggests that you have a urinary tract infection (UTI).  Please take your antibiotic as prescribed and over-the-counter pain medication (Tylenol or Motrin) as needed, but no more than recommended on the label instructions.  Drink PLENTY of fluids.  Call your regular doctor to schedule the next available appointment to follow up on today's ED visit, or return immediately to the ED if your pain worsens, you have decreased urine production, develop fever, persistent vomiting, or other symptoms that concern you.

## 2023-07-25 LAB — URINE CULTURE

## 2024-07-06 ENCOUNTER — Other Ambulatory Visit: Payer: Self-pay

## 2024-07-06 ENCOUNTER — Encounter: Payer: Self-pay | Admitting: Emergency Medicine

## 2024-07-06 ENCOUNTER — Emergency Department

## 2024-07-06 ENCOUNTER — Emergency Department
Admission: EM | Admit: 2024-07-06 | Discharge: 2024-07-06 | Discharge status: Against Medical Advice | Attending: Emergency Medicine | Admitting: Emergency Medicine

## 2024-07-06 DIAGNOSIS — R079 Chest pain, unspecified: Secondary | ICD-10-CM | POA: Insufficient documentation

## 2024-07-06 DIAGNOSIS — R110 Nausea: Secondary | ICD-10-CM | POA: Insufficient documentation

## 2024-07-06 DIAGNOSIS — R1310 Dysphagia, unspecified: Secondary | ICD-10-CM | POA: Insufficient documentation

## 2024-07-06 DIAGNOSIS — Z5321 Procedure and treatment not carried out due to patient leaving prior to being seen by health care provider: Secondary | ICD-10-CM | POA: Insufficient documentation

## 2024-07-06 DIAGNOSIS — R0602 Shortness of breath: Secondary | ICD-10-CM | POA: Diagnosis not present

## 2024-07-06 NOTE — ED Notes (Signed)
 This tech trainee EDT Harlene attempted to get labs from pt but was not successful twice. This tech then tried to obtain labs from pt but pt refused at the moment and asked if she could do it later,this tech did inform pt that waiting to do lab work could delay care for pt and pt understood, pt is now back in waiting room. This tech informed first nurse Olam.

## 2024-07-06 NOTE — ED Triage Notes (Addendum)
 Patient ambulatory to triage with steady gait, without difficulty or distress noted; pt reports she awoke about 5hrs ago with upper CP radiating down the middle and diff swallowing, initially accomp by [facility] and nausea; st pain persists; denies hx of same
